# Patient Record
Sex: Female | Born: 1985 | Race: Black or African American | Hispanic: No | Marital: Married | Smoking: Current every day smoker
Health system: Southern US, Community
[De-identification: ages and names within clinical notes are randomized; demographics above are authoritative.]

## PROBLEM LIST (undated history)

## (undated) DIAGNOSIS — F99 Mental disorder, not otherwise specified: Secondary | ICD-10-CM

## (undated) HISTORY — DX: Mental disorder, not otherwise specified: F99

---

## 2008-06-30 ENCOUNTER — Observation Stay: Payer: Self-pay

## 2008-10-01 ENCOUNTER — Observation Stay: Payer: Self-pay | Admitting: Obstetrics and Gynecology

## 2008-10-05 ENCOUNTER — Observation Stay: Payer: Self-pay

## 2008-10-13 ENCOUNTER — Inpatient Hospital Stay: Payer: Self-pay | Admitting: Obstetrics and Gynecology

## 2009-12-04 ENCOUNTER — Encounter: Payer: Self-pay | Admitting: Maternal & Fetal Medicine

## 2010-01-04 ENCOUNTER — Emergency Department: Payer: Self-pay | Admitting: Unknown Physician Specialty

## 2010-04-17 ENCOUNTER — Observation Stay: Payer: Self-pay | Admitting: Obstetrics and Gynecology

## 2012-01-26 HISTORY — PX: ABDOMINAL SURGERY: SHX537

## 2013-01-25 HISTORY — PX: AUGMENTATION MAMMAPLASTY: SUR837

## 2017-03-19 ENCOUNTER — Emergency Department (HOSPITAL_COMMUNITY)
Admission: EM | Admit: 2017-03-19 | Discharge: 2017-03-19 | Discharge status: Against Medical Advice | Payer: Self-pay | Attending: Emergency Medicine | Admitting: Emergency Medicine

## 2017-03-19 ENCOUNTER — Encounter (HOSPITAL_COMMUNITY): Payer: Self-pay | Admitting: Emergency Medicine

## 2017-03-19 ENCOUNTER — Emergency Department (HOSPITAL_COMMUNITY): Payer: Self-pay

## 2017-03-19 DIAGNOSIS — N6452 Nipple discharge: Secondary | ICD-10-CM | POA: Insufficient documentation

## 2017-03-19 DIAGNOSIS — F17210 Nicotine dependence, cigarettes, uncomplicated: Secondary | ICD-10-CM | POA: Insufficient documentation

## 2017-03-19 DIAGNOSIS — Z5320 Procedure and treatment not carried out because of patient's decision for unspecified reasons: Secondary | ICD-10-CM | POA: Insufficient documentation

## 2017-03-19 LAB — BASIC METABOLIC PANEL
ANION GAP: 11 (ref 5–15)
BUN: 11 mg/dL (ref 6–20)
CALCIUM: 9.3 mg/dL (ref 8.9–10.3)
CO2: 19 mmol/L — AB (ref 22–32)
Chloride: 106 mmol/L (ref 101–111)
Creatinine, Ser: 0.94 mg/dL (ref 0.44–1.00)
Glucose, Bld: 163 mg/dL — ABNORMAL HIGH (ref 65–99)
Potassium: 4.5 mmol/L (ref 3.5–5.1)
SODIUM: 136 mmol/L (ref 135–145)

## 2017-03-19 LAB — CBC
HCT: 38.5 % (ref 36.0–46.0)
HEMOGLOBIN: 13.2 g/dL (ref 12.0–15.0)
MCH: 30.7 pg (ref 26.0–34.0)
MCHC: 34.3 g/dL (ref 30.0–36.0)
MCV: 89.5 fL (ref 78.0–100.0)
Platelets: 262 10*3/uL (ref 150–400)
RBC: 4.3 MIL/uL (ref 3.87–5.11)
RDW: 13 % (ref 11.5–15.5)
WBC: 11.7 10*3/uL — AB (ref 4.0–10.5)

## 2017-03-19 LAB — I-STAT BETA HCG BLOOD, ED (MC, WL, AP ONLY)

## 2017-03-19 LAB — I-STAT TROPONIN, ED: TROPONIN I, POC: 0 ng/mL (ref 0.00–0.08)

## 2017-03-19 NOTE — ED Provider Notes (Signed)
Emergency Department Provider Note   I have reviewed the triage vital signs and the nursing notes.   HISTORY  Chief Complaint Breast Discharge   HPI Debra Weaver is a 32 y.o. female without significant past medical history does not take medications the presents to the emergency department today secondary to bilateral breast discharge.  Patient states that she switched up a couple of her psychiatric medications a couple months ago and since that time she is noticed that she been having some breast discharge.  She does not have any pain or lumps or masses or change in the size or sensation of her breast.  States she says a clear milky discharge similar to when she was pregnant however she knows that she is not pregnant.  She endorses a decreased appetite and weight loss during that time as well.  She states her to have some intermittent chest discomfort that lasts approximately 5-7 minutes has not similar to previous episodes of anxiety.  Is having some shortness of breath with that that is mild.  No nausea, lightheadedness or diaphoresis. No other associated or modifying symptoms.    History reviewed. No pertinent past medical history.  There are no active problems to display for this patient.   Past Surgical History:  Procedure Laterality Date  . ABDOMINAL SURGERY  2014      Allergies Patient has no allergy information on record.  History reviewed. No pertinent family history.  Social History Social History   Tobacco Use  . Smoking status: Current Every Day Smoker    Packs/day: 0.25  . Smokeless tobacco: Never Used  Substance Use Topics  . Alcohol use: Yes    Alcohol/week: 0.6 oz    Types: 1 Glasses of wine per week  . Drug use: Yes    Types: Marijuana    Review of Systems  All other systems negative except as documented in the HPI. All pertinent positives and negatives as reviewed in the HPI. ____________________________________________   PHYSICAL  EXAM:  VITAL SIGNS: ED Triage Vitals  Enc Vitals Group     BP 03/19/17 1114 91/72     Pulse Rate 03/19/17 1114 77     Resp 03/19/17 1114 18     Temp 03/19/17 1114 98.8 F (37.1 C)     Temp Source 03/19/17 1114 Oral     SpO2 03/19/17 1114 100 %     Weight 03/19/17 1115 156 lb (70.8 kg)     Height 03/19/17 1115 5\' 4"  (1.626 m)    Constitutional: Alert and oriented. Well appearing and in no acute distress. Eyes: Conjunctivae are normal. PERRL. EOMI. Head: Atraumatic. Nose: No congestion/rhinnorhea. Mouth/Throat: Mucous membranes are moist.  Oropharynx non-erythematous. Neck: No stridor.  No meningeal signs.   Cardiovascular: Normal rate, regular rhythm. Good peripheral circulation. Grossly normal heart sounds.   Respiratory: Normal respiratory effort.  No retractions. Lungs CTAB. Gastrointestinal: Soft and nontender. No distention.  Musculoskeletal: No lower extremity tenderness nor edema. No gross deformities of extremities. Neurologic:  Normal speech and language. No gross focal neurologic deficits are appreciated.  Skin:  Skin is warm, dry and intact. No rash noted. Breast: done with chaperone Lyndel Safe(Elizabeth [place], [place]) present. Mild milky clear white discharge. No tenderness. No masses. No erythema. Breasts symmetrical. No dimpling. No peau d' orange.    ____________________________________________   LABS (all labs ordered are listed, but only abnormal results are displayed)  Labs Reviewed  BASIC METABOLIC PANEL - Abnormal; Notable for the following components:  Result Value   CO2 19 (*)    Glucose, Bld 163 (*)    All other components within normal limits  CBC - Abnormal; Notable for the following components:   WBC 11.7 (*)    All other components within normal limits  PROLACTIN  TSH  URINALYSIS, ROUTINE W REFLEX MICROSCOPIC  I-STAT TROPONIN, ED  I-STAT BETA HCG BLOOD, ED ([facility], [facility], [facility] ONLY)   ____________________________________________  EKG   EKG  Interpretation  Date/Time:  Saturday March 19 2017 11:06:46 EST Ventricular Rate:  88 PR Interval:  112 QRS Duration: 82 QT Interval:  368 QTC Calculation: 445 R Axis:   82 Text Interpretation:  Sinus rhythm with marked sinus arrhythmia Nonspecific T wave abnormality Abnormal ECG No old tracing to compare Confirmed by Marily Memos [phone]) on 03/19/2017 3:13:30 PM       ____________________________________________  RADIOLOGY  Dg Chest 2 View  Result Date: 03/19/2017 CLINICAL DATA:  Pt is having CP and chest pressure x 3 months with some SOB. Pt states that pressure and pain is intermittent and that her SOB is more frequent that the pain. Pt does had a hx of a collapsed lung. Pt was unable to remove nipple piercing's for exam. EXAM: CHEST  2 VIEW COMPARISON:  None. FINDINGS: The heart size and mediastinal contours are within normal limits. Both lungs are clear. No pleural effusion or pneumothorax. The visualized skeletal structures are unremarkable. IMPRESSION: Normal chest radiographs. Electronically Signed   By: Amie Portland M.D.   On: 03/19/2017 12:14    ____________________________________________   PROCEDURES  Procedure(s) performed:   Procedures   ____________________________________________   INITIAL IMPRESSION / ASSESSMENT AND PLAN / ED COURSE  Breast discharge - will check tsh/prolactin Weight loss - likely 2/2 decreased appeitite. Could be related to above and changes in hormone levels but could also be neoplastic or related to medications.  Chest pain and sob. - possibly anxiety. ecg and troponin ok. PERC negative from PE standpoint. CXR without evidence of mass, PTX or other acute causes.   Will add on TSH and prolactin levels and disposition accordingly.   Patient eloped prior to completing workup.   Pertinent labs & imaging results that were available during my care of the patient were reviewed by me and considered in my medical decision making (see  chart for details).  ____________________________________________  FINAL CLINICAL IMPRESSION(S) / ED DIAGNOSES  Final diagnoses:  Breast discharge     MEDICATIONS GIVEN DURING THIS VISIT:  Medications - No data to display   NEW OUTPATIENT MEDICATIONS STARTED DURING THIS VISIT:  There are no discharge medications for this patient.   Note:  This note was prepared with assistance of Dragon voice recognition software. Occasional wrong-word or sound-a-like substitutions may have occurred due to the inherent limitations of voice recognition software.   Marily Memos, MD 03/19/17 [phone]

## 2017-03-19 NOTE — ED Notes (Signed)
Pt not in room per previous nurse who thinks that pt left AMA.

## 2017-03-19 NOTE — ED Triage Notes (Signed)
PT presents to ED for assessment of bilateral nipple discharge (patient states breast milk) for 2 weeks, with intermittent chest pressure that causes SOB.  Patient also c/o loss of appetite for 3 months with weightloss.

## 2017-03-28 ENCOUNTER — Encounter: Payer: Self-pay | Admitting: Family Medicine

## 2017-03-28 ENCOUNTER — Ambulatory Visit (INDEPENDENT_AMBULATORY_CARE_PROVIDER_SITE_OTHER): Payer: Medicaid Other | Admitting: Family Medicine

## 2017-03-28 ENCOUNTER — Other Ambulatory Visit (HOSPITAL_COMMUNITY)
Admission: RE | Admit: 2017-03-28 | Discharge: 2017-03-28 | Disposition: A | Discharge status: Home / Self Care | Payer: Medicaid Other | Source: Ambulatory Visit | Attending: Family Medicine | Admitting: Family Medicine

## 2017-03-28 VITALS — BP 115/79 | HR 81 | Ht 64.0 in | Wt 136.0 lb

## 2017-03-28 DIAGNOSIS — N643 Galactorrhea not associated with childbirth: Secondary | ICD-10-CM | POA: Diagnosis present

## 2017-03-28 DIAGNOSIS — Z309 Encounter for contraceptive management, unspecified: Secondary | ICD-10-CM | POA: Diagnosis not present

## 2017-03-28 DIAGNOSIS — R634 Abnormal weight loss: Secondary | ICD-10-CM | POA: Insufficient documentation

## 2017-03-28 DIAGNOSIS — Z01419 Encounter for gynecological examination (general) (routine) without abnormal findings: Secondary | ICD-10-CM

## 2017-03-28 DIAGNOSIS — N63 Unspecified lump in unspecified breast: Secondary | ICD-10-CM

## 2017-03-28 MED ORDER — CEPHALEXIN 500 MG PO CAPS: 500.0000 mg | ORAL_CAPSULE | Freq: Three times a day (TID) | ORAL | 0 refills | Status: DC

## 2017-03-28 NOTE — Progress Notes (Signed)
Breast lump on left and leakage from both.patient states she stopped taking her trazodone and risperone this week. Armandina StammerJennifer Shanara Schnieders RN BSN

## 2017-03-28 NOTE — Progress Notes (Signed)
GYNECOLOGY ANNUAL PREVENTATIVE CARE ENCOUNTER NOTE  Subjective:   Debra Weaver is a 32 y.o. G4P3 female here for a routine annual gynecologic exam.  Current complaints:  1. Breast lump - noticed this morning. Tender. 2. Weight loss - 30# in a month.  Has loss of appetite. 3. Galactorrhea - was on Risperidone and trazodone for 3-4 weeks due to weight loss. Stopped them about 1-2 weeks ago. Noticing that the galactorhhea is improving. 4. Hot flashes - frequent with night sweats   Denies abnormal vaginal bleeding, discharge, pelvic pain, problems with intercourse or other gynecologic concerns.    Does have history of breast implants - over muscle. Thinks that they are silacone.  Gynecologic History Patient's last menstrual period was 03/17/2017. Patient is sexually active  Contraception: none Last Pap: unsure. Results were: normal Last mammogram: n/a.  Obstetric History OB History  Gravida Para Term Preterm AB Living  4 3          SAB TAB Ectopic Multiple Live Births          3    # Outcome Date GA Lbr Len/2nd Weight Sex Delivery Anes PTL Lv  4 Gravida           3 Para           2 Para           1 Para               Past Medical History:  Diagnosis Date  . Mental disorder    bipolar - stopped meds    Past Surgical History:  Procedure Laterality Date  . ABDOMINAL SURGERY  2014  . CESAREAN SECTION      Current Outpatient Medications on File Prior to Visit  Medication Sig Dispense Refill  . risperiDONE (RISPERDAL) 0.25 MG tablet Take 0.25 mg by mouth at bedtime.    . traZODone (DESYREL) 150 MG tablet Take by mouth at bedtime.     No current facility-administered medications on file prior to visit.     No Known Allergies  Social History   Socioeconomic History  . Marital status: Married    Spouse name: Not on file  . Number of children: Not on file  . Years of education: Not on file  . Highest education level: Not on file  Social Needs  . Financial  resource strain: Not on file  . Food insecurity - worry: Not on file  . Food insecurity - inability: Not on file  . Transportation needs - medical: Not on file  . Transportation needs - non-medical: Not on file  Occupational History  . Not on file  Tobacco Use  . Smoking status: Current Every Day Smoker    Packs/day: 0.25  . Smokeless tobacco: Never Used  Substance and Sexual Activity  . Alcohol use: Yes    Alcohol/week: 0.6 oz    Types: 1 Glasses of wine per week  . Drug use: Yes    Types: Marijuana  . Sexual activity: Not on file  Other Topics Concern  . Not on file  Social History Narrative  . Not on file    Family History  Problem Relation Age of Onset  . Cancer Maternal Grandmother   . Breast cancer Maternal Grandmother   . Cancer Mother   . Breast cancer Mother   . Diabetes Neg Hx   . Hypertension Neg Hx     The following portions of the patient's history were reviewed and updated  as appropriate: allergies, current medications, past family history, past medical history, past social history, past surgical history and problem list.  Review of Systems Pertinent items noted in HPI and remainder of comprehensive ROS otherwise negative.   Objective:  BP 115/79   Pulse 81   Ht _0  (1.626 m)   Wt 136 lb (61.7 kg)   LMP 03/17/2017   BMI 23.34 kg/m  CONSTITUTIONAL: Well-developed, well-nourished female in no acute distress.  HENT:  Normocephalic, atraumatic, External right and left ear normal. Oropharynx is clear and moist EYES: Conjunctivae and EOM are normal. Pupils are equal, round, and reactive to light. No scleral icterus.  NECK: Normal range of motion, supple, no masses.  Normal thyroid.   CARDIOVASCULAR: Normal heart rate noted, regular rhythm RESPIRATORY: Clear to auscultation bilaterally. Effort and breath sounds normal, no problems with respiration noted. BREASTS: Symmetric in size. Right breast: No masses, skin changes, nipple drainage, or  lymphadenopathy. Left breast: 3cm firm indurated, warm, tender lump 6-7 oclock at edge of areola.  ABDOMEN: Soft, normal bowel sounds, no distention noted.  No tenderness, rebound or guarding.  PELVIC: Normal appearing external genitalia; normal appearing vaginal mucosa and cervix.  No abnormal discharge noted.  Pap smear obtained.  Normal uterine size, no other palpable masses, no uterine or adnexal tenderness. MUSCULOSKELETAL: Normal range of motion. No tenderness.  No cyanosis, clubbing, or edema.  2+ distal pulses. SKIN: Skin is warm and dry. No rash noted. Not diaphoretic. No erythema. No pallor. NEUROLOGIC: Alert and oriented to person, place, and time. Normal reflexes, muscle tone coordination. No cranial nerve deficit noted. PSYCHIATRIC: Normal mood and affect. Normal behavior. Normal judgment and thought content.  Assessment:  Annual gynecologic examination with pap smear   Plan:  1. Well Woman Exam Will follow up results of pap smear and manage accordingly. STD testing discussed. Patient requested testing  - Cytology - PAP - Comp Met (CMET) - TSH - CBC - Hepatitis B surface antigen - Hepatitis C antibody - HIV antibody - Follicle stimulating hormone  2. Breast lump in female Appears infectious - will treat with keflex. No fluctuance. If does not improve after abx, will send for imaging.  3. Galactorrhea Likely secondary to risperidone.  - Prolactin  4. Weight loss - Comp Met (CMET) - TSH - HIV antibody   Routine preventative health maintenance measures emphasized. Please refer to After Visit Summary for other counseling recommendations.    Loma Boston, [place] for [employer]

## 2017-03-29 LAB — COMPREHENSIVE METABOLIC PANEL
ALK PHOS: 65 IU/L (ref 39–117)
ALT: 9 IU/L (ref 0–32)
AST: 23 IU/L (ref 0–40)
Albumin/Globulin Ratio: 1.5 (ref 1.2–2.2)
Albumin: 4.6 g/dL (ref 3.5–5.5)
BUN/Creatinine Ratio: 11 (ref 9–23)
BUN: 9 mg/dL (ref 6–20)
Bilirubin Total: 0.3 mg/dL (ref 0.0–1.2)
CALCIUM: 9.4 mg/dL (ref 8.7–10.2)
CO2: 19 mmol/L — AB (ref 20–29)
CREATININE: 0.79 mg/dL (ref 0.57–1.00)
Chloride: 105 mmol/L (ref 96–106)
GFR calc Af Amer: 115 mL/min/{1.73_m2} (ref >59)
GFR, EST NON AFRICAN AMERICAN: 100 mL/min/{1.73_m2} (ref >59)
GLOBULIN, TOTAL: 3.1 g/dL (ref 1.5–4.5)
GLUCOSE: 61 mg/dL — AB (ref 65–99)
Potassium: 4.3 mmol/L (ref 3.5–5.2)
SODIUM: 140 mmol/L (ref 134–144)
Total Protein: 7.7 g/dL (ref 6.0–8.5)

## 2017-03-29 LAB — CBC
HEMATOCRIT: 38 % (ref 34.0–46.6)
Hemoglobin: 12.7 g/dL (ref 11.1–15.9)
MCH: 30.4 pg (ref 26.6–33.0)
MCHC: 33.4 g/dL (ref 31.5–35.7)
MCV: 91 fL (ref 79–97)
Platelets: 253 10*3/uL (ref 150–379)
RBC: 4.18 x10E6/uL (ref 3.77–5.28)
RDW: 13.7 % (ref 12.3–15.4)
WBC: 7.8 10*3/uL (ref 3.4–10.8)

## 2017-03-29 LAB — HEPATITIS C ANTIBODY: Hep C Virus Ab: <0.1 s/co ratio (ref 0.0–0.9)

## 2017-03-29 LAB — HEPATITIS B SURFACE ANTIGEN: HEP B S AG: NEGATIVE

## 2017-03-29 LAB — TSH: TSH: 1.62 u[IU]/mL (ref 0.450–4.500)

## 2017-03-29 LAB — HIV ANTIBODY (ROUTINE TESTING W REFLEX): HIV SCREEN 4TH GENERATION: NONREACTIVE

## 2017-03-29 LAB — FOLLICLE STIMULATING HORMONE: FSH: 3.5 m[IU]/mL

## 2017-03-29 LAB — PROLACTIN: PROLACTIN: 17.7 ng/mL (ref 4.8–23.3)

## 2017-03-30 ENCOUNTER — Telehealth: Payer: Self-pay

## 2017-03-30 NOTE — Telephone Encounter (Signed)
Patient called stating her breast lump has gotten bigger and more painful from Monday 03-28-17. Patient states she is taking the antibiotic we gave her. Suggested trying warm compress on the area and tylenol for pain.  Patient requesting to come in sooner, patient booked from tomorrow 03-31-17. Armandina StammerJennifer Howard RNBSN

## 2017-03-31 ENCOUNTER — Emergency Department (HOSPITAL_COMMUNITY): Payer: Self-pay

## 2017-03-31 ENCOUNTER — Encounter (HOSPITAL_COMMUNITY): Payer: Self-pay

## 2017-03-31 ENCOUNTER — Encounter: Payer: Self-pay | Admitting: Family Medicine

## 2017-03-31 ENCOUNTER — Emergency Department (HOSPITAL_COMMUNITY)
Admission: EM | Admit: 2017-03-31 | Discharge: 2017-03-31 | Disposition: A | Discharge status: Home / Self Care | Payer: Self-pay | Attending: Emergency Medicine | Admitting: Emergency Medicine

## 2017-03-31 ENCOUNTER — Ambulatory Visit (INDEPENDENT_AMBULATORY_CARE_PROVIDER_SITE_OTHER): Payer: Self-pay | Admitting: Family Medicine

## 2017-03-31 VITALS — BP 130/82 | HR 90 | Wt 140.0 lb

## 2017-03-31 DIAGNOSIS — R197 Diarrhea, unspecified: Secondary | ICD-10-CM | POA: Insufficient documentation

## 2017-03-31 DIAGNOSIS — N61 Mastitis without abscess: Secondary | ICD-10-CM

## 2017-03-31 DIAGNOSIS — N611 Abscess of the breast and nipple: Secondary | ICD-10-CM

## 2017-03-31 DIAGNOSIS — Z79899 Other long term (current) drug therapy: Secondary | ICD-10-CM | POA: Insufficient documentation

## 2017-03-31 DIAGNOSIS — L03313 Cellulitis of chest wall: Secondary | ICD-10-CM | POA: Insufficient documentation

## 2017-03-31 DIAGNOSIS — R0602 Shortness of breath: Secondary | ICD-10-CM | POA: Insufficient documentation

## 2017-03-31 DIAGNOSIS — F17210 Nicotine dependence, cigarettes, uncomplicated: Secondary | ICD-10-CM | POA: Insufficient documentation

## 2017-03-31 LAB — CYTOLOGY - PAP
ADEQUACY: ABSENT
CHLAMYDIA, DNA PROBE: NEGATIVE
Diagnosis: NEGATIVE
HPV (WINDOPATH): NOT DETECTED
NEISSERIA GONORRHEA: NEGATIVE

## 2017-03-31 MED ORDER — ACETAMINOPHEN 325 MG PO TABS: 650.0000 mg | ORAL_TABLET | Freq: Four times a day (QID) | ORAL | 0 refills | Status: AC | PRN

## 2017-03-31 MED ORDER — DOXYCYCLINE HYCLATE 100 MG PO CAPS: 100.0000 mg | ORAL_CAPSULE | Freq: Two times a day (BID) | ORAL | 0 refills | Status: AC

## 2017-03-31 NOTE — ED Notes (Signed)
Bed: WTR6 Expected date:  Expected time:  Means of arrival:  Comments: 

## 2017-03-31 NOTE — Progress Notes (Signed)
Patient is cussing at intake about emergency room experience. Patient states that no one told her what is going on. Let patient voice her concerns of her ER experience and then told her we will evaluate her today.  Armandina StammerJennifer Angala Hilgers RNBSN     After exam when trying to schedule her Imaging since she has medicaid family planning  patient walked out of office before we could get her imaging scheduled. Unable to schedule with [facility] of [place] due to her insurance coverage. We were in process of contacting [facility] program to see what financial assistance could be started when patient walked out of office. Armandina StammerJennifer Freeman Borba RNBSN

## 2017-03-31 NOTE — ED Notes (Signed)
Pt left w/o discharge paperwork

## 2017-03-31 NOTE — Discharge Instructions (Signed)
You were given a prescription for antibiotics. Please take the antibiotic prescription fully.  You were Also given a prescription for Tylenol.  You may take 650 mg of Tylenol every 6 hours as needed for pain.  Do not take more than 4000 mg of Tylenol in 1 day.  Please check with your pharmacist to make sure that neither of these medications interact with the medications that you are currently on.  If you have any symptoms that you cannot tolerate while taking these medications, stop the medication immediately.  Please follow up with your primary doctor within the next 7-10 days for re-evaluation and further treatment of your symptoms.   Please return to the ER sooner if you have any new or worsening symptoms including chest pain, shortness of breath, or fevers.

## 2017-03-31 NOTE — Progress Notes (Signed)
   Subjective:    Patient ID: Debra Weaver, female    DOB: 07/08/1985, 32 y.o.   MRN: [id number]  HPI Patient seen - was seen 3 days ago for annual and had painful breast lump that started earlier that day. Appears to be an abscess - keflex prescribed. Patient has taken the antibiotic for the past 3 days without improvement. [facility] and was prescribed doxy. No fevers, chills, nausea.   Review of Systems     Objective:   Physical Exam  Constitutional: She is oriented to person, place, and time. She appears well-developed and well-nourished.  Pulmonary/Chest:    Neurological: She is alert and oriented to person, place, and time.  Skin: Skin is warm and dry.  Psychiatric: She has a normal mood and affect. Her behavior is normal. Judgment and thought content normal.      Assessment & Plan:  1. Breast Abscess Start doxycycline as prescribed. Attempted to make appt for mammogram and US for breast at breast center, but [facility] While trying to figure this, pt left office. Will refer pt to [facility] for imaging. - US BREAST LTD UNI LEFT INC AXILLA; Future - MM DIAG BREAST TOMO BILATERAL; Future

## 2017-03-31 NOTE — ED Provider Notes (Addendum)
[facility]-EMERGENCY DEPT Provider Note   CSN: [id number] Arrival date & time: 03/31/17  0139     History   Chief Complaint Chief Complaint  Patient presents with  . Abscess    HPI Danuta Huseman is a 32 y.o. female.  HPI   Patient is a 32 year old female who presents the ED today complaining of 9/10 left breast pain that began 1 week ago.  States pain is stabbing/burning.  She is tried warm compresses with no relief.  Also has tried Tylenol with no relief.  Pain is worse to palpation.  Patient states that she was seen by her OB/GYN 3 days ago and was started on Keflex for suspected cellulitis.  Since then the pain, swelling, and redness has gotten worse.  She denies any drainage from the area.  She does endorse bilateral nipple discharge for several months, that her doctor states is due to her being on risperidone.  Patient has history of implants in 2015, had procedure done by plastic surgeon in [place], [place].  Patient is also complaining of central chest heaviness that has been present for 2 months.  Has associated intermittent shortness of breath, but none now.  No cough or other URI symptoms.  Denies any abdominal pain, nausea, vomiting.  Endorses intermittent diarrhea, denies any blood in stool.  Denies any documented fevers at home, but does endorse night sweats.  Also endorses 32 pound weight loss in 60 days.  States she has had loss of appetite.  Denies leg pain/swelling, hemoptysis, recent surgery/trauma, recent long travel, hormone use, personal hx of cancer, or hx of DVT/PE.   Past Medical History:  Diagnosis Date  . Mental disorder    bipolar - stopped meds    There are no active problems to display for this patient.   Past Surgical History:  Procedure Laterality Date  . ABDOMINAL SURGERY  2014  . CESAREAN SECTION      OB History    Gravida Para Term Preterm AB Living   4 3           SAB TAB Ectopic Multiple Live Births           3        Home Medications    Prior to Admission medications   Medication Sig Start Date End Date Taking? Authorizing Provider  acetaminophen (TYLENOL) 325 MG tablet Take 2 tablets (650 mg total) by mouth every 6 (six) hours as needed. Do not take more than 4000mg  of tylenol per day 03/31/17   Darlene Bartelt S, PA-C  cephALEXin (KEFLEX) 500 MG capsule Take 1 capsule (500 mg total) by mouth 3 (three) times daily. 03/28/17   Levie Heritage, DO  doxycycline (VIBRAMYCIN) 100 MG capsule Take 1 capsule (100 mg total) by mouth 2 (two) times daily for 7 days. 03/31/17 04/07/17  Janira Mandell S, PA-C  risperiDONE (RISPERDAL) 0.25 MG tablet Take 0.25 mg by mouth at bedtime.    [provider]  traZODone (DESYREL) 150 MG tablet Take by mouth at bedtime.    [provider]    Family History Family History  Problem Relation Age of Onset  . Cancer Maternal Grandmother   . Breast cancer Maternal Grandmother   . Cancer Mother   . Breast cancer Mother   . Diabetes Neg Hx   . Hypertension Neg Hx     Social History Social History   Tobacco Use  . Smoking status: Current Every Day Smoker    Packs/day:  0.25  . Smokeless tobacco: Never Used  Substance Use Topics  . Alcohol use: Yes    Alcohol/week: 0.6 oz    Types: 1 Glasses of wine per week  . Drug use: Yes    Types: Marijuana     Allergies   Patient has no known allergies.   Review of Systems Review of Systems  Constitutional: Positive for appetite change and unexpected weight change. Negative for fever.       Night sweats  HENT: Negative for congestion and sore throat.   Eyes: Negative for visual disturbance.  Respiratory: Positive for shortness of breath. Negative for cough.   Cardiovascular: Positive for chest pain. Negative for palpitations and leg swelling.  Gastrointestinal: Positive for diarrhea. Negative for abdominal pain, constipation, nausea and vomiting.  Genitourinary: Negative for flank pain.   Musculoskeletal: Negative for back pain and neck pain.  Skin:       Redness, pain, swelling to left breast  Neurological: Negative for headaches.     Physical Exam Updated Vital Signs BP 111/63 (BP Location: Left Arm)   Pulse 98   Temp 98 F (36.7 C) (Oral)   Resp 20   LMP 03/17/2017   SpO2 100%   Physical Exam  Constitutional: She appears well-developed and well-nourished. No distress.  HENT:  Head: Normocephalic and atraumatic.  Eyes: Conjunctivae are normal.  Neck: Neck supple.  Cardiovascular: Normal rate, regular rhythm, normal heart sounds and intact distal pulses.  No murmur heard. Pulmonary/Chest: Effort normal and breath sounds normal. No stridor. No respiratory distress. She has no wheezes.  Chest tenderness to central part of chest, that reproduces pain.  Abdominal: Soft. Bowel sounds are normal. She exhibits no distension. There is no tenderness.  Musculoskeletal: She exhibits no edema.  Neurological: She is alert.  Skin: Skin is warm and dry.  Pt has cellulitic area to left breast to medial and inferior aspect of left breast that is TTP and erythematous. Area is indurated, but no fluctuance noted. No obvious drainage noted on exam  Psychiatric: She has a normal mood and affect.  Nursing note and vitals reviewed.    ED Treatments / Results  Labs (all labs ordered are listed, but only abnormal results are displayed) Labs Reviewed - No data to display  EKG  EKG Interpretation None       Radiology Dg Chest 2 View  Result Date: 03/31/2017 CLINICAL DATA:  Left breast pain swelling, erythema, and warmth sensation. The patient has been on antibiotics for the past 3 days but the symptoms are worsening. EXAM: CHEST - 2 VIEW COMPARISON:  Chest x-ray dated March 19, 2017 FINDINGS: The lungs are well-expanded. There is no focal infiltrate. There is no pleural effusion. The heart and pulmonary vascularity are normal. The mediastinum is normal in width. The  trachea is midline. The bony thorax exhibits no acute abnormality. There are breast implants present bilaterally. No soft tissue gas collections within the visualized portions of the breasts are seen. IMPRESSION: There is no active cardiopulmonary disease. No specific radiographic abnormality of either breast. There are bilateral breast implants. Electronically Signed   By: David  Swazil[place]Jordan M.D.   On: 03/31/2017 07:52    Procedures Procedures (including critical care time)  Medications Ordered in ED Medications - No data to display   Initial Impression / Assessment and Plan / ED Course  I have reviewed the triage vital signs and the nursing notes.  Pertinent labs & imaging results that were available during my care of  the patient were reviewed by me and considered in my medical decision making (see chart for details).   Pt became agitated stating that doctors don't know what they are doing and that she has had bad experiences with medical encounters in the past. states she has sued mult hospitals in the past.  Discussed pt presentation and exam findings with Dr. Effie Shy, who personally evaluated the patient.  Patient left before I was able to see her after Dr. Effie Shy.  She left without me being able to give her discharge paperwork.  I did discuss the plan to give different antibiotics as well as pain medication, and advised her to follow-up with her OB/GYN doctor and return if worse. Had discussed results of chest x-ray as well.  Gave discharge instructions to patient husband.   Final Clinical Impressions(s) / ED Diagnoses   Final diagnoses:  Cellulitis of left breast   Patient is a 32 year old female who presents to the ED complaining of left breast abscess which has worsened since starting Keflex 3 days ago.  Will change antibiotic to MRSA covering agent.  Patient has follow-up with her OB/GYN today.  Discussed follow-up with patient's plastic surgeon, however plastic surgeon is located to  [place].  Patient agrees to keep appointment with OB-GYN today for further assessment and workup of this.  She has no evidence of systemic infection including no fevers. normal vital signs.  Area of concern is not amenable to I&D. Patient complained of chest pain x2 months, however had negative chest pain workup in the ED on 03/19/17 for same pain which is unchanged today.  Patient has no history of cardiac disease, she has minimal risk factors.  Doubt ACS.  Doubt PE, PERC negative. CXR negative for PNA or PTX. CP reproducible on exam. Doubt other life threatening cardiopulmonary etiology of CP at this point. Appropriate for outpt workup. Pt given strict return precautions for new or worsening pain or sob. Agrees to keep appt at 830 and return if worse.   ED Discharge Orders        Ordered    doxycycline (VIBRAMYCIN) 100 MG capsule  2 times daily     03/31/17 0725    acetaminophen (TYLENOL) 325 MG tablet  Every 6 hours PRN     03/31/17 0725       Winslow Ederer, [employer], PA-C 03/31/17 1724    Reita Shindler S, PA-C 03/31/17 1914    Mancel Bale, MD 04/01/17 1206

## 2017-03-31 NOTE — ED Provider Notes (Signed)
  Face-to-face evaluation   History: The patient presents for evaluation of the left breast problem described by her as drainage.  I saw the patient at 08:20 hours.  She had been seen earlier by the APP, and after we discussed the case, and I reviewed the record, I want to see the patient.  At this point the patient was fully dressed, pacing in the room, angry, cursing, and stated that "you people are not doing anything for me."  She then stated that she had an appointment at 9:00 and would go to see Dr. Adrian BlackwaterStinson for that visit.  I encouraged her that this made sense because she could get good comprehensive treatment there as he has already evaluated her, 4 days ago.  The patient was with her husband and 3 children at this time, and they decided to walk out without discharge instructions or the prescription which had been written earlier.  Physical exam: Alert female.  Ambulating without distress.  She is fully clothed and did not participate in physical exam for me.  Medical screening examination/treatment/procedure(s) were conducted as a shared visit with non-physician practitioner(s) and myself.  I personally evaluated the patient during the encounter    Mancel BaleWentz, Debra Brackney, MD 03/31/17 [phone]

## 2017-03-31 NOTE — ED Notes (Signed)
Pt complains of her left breast being red, swollen and warm, she states that it's very sore and has been taking an antibiotic since Monday but the pain and swelling is worse

## 2017-04-01 ENCOUNTER — Telehealth: Payer: Self-pay

## 2017-04-01 ENCOUNTER — Other Ambulatory Visit (HOSPITAL_COMMUNITY): Payer: Self-pay | Admitting: *Deleted

## 2017-04-01 ENCOUNTER — Other Ambulatory Visit: Payer: Self-pay | Admitting: Family Medicine

## 2017-04-01 DIAGNOSIS — N611 Abscess of the breast and nipple: Secondary | ICD-10-CM

## 2017-04-01 DIAGNOSIS — N6320 Unspecified lump in the left breast, unspecified quadrant: Secondary | ICD-10-CM

## 2017-04-01 NOTE — Telephone Encounter (Signed)
Contacted patient to make her aware that she will meet with Debra B. With [facility] and then have her visit with Dr. Adrian Weaver. Patient states understanding and given [facility].   Stressed importance for patient to start taking her medication so that she has several days on board before being evaluated again. Debra StammerJennifer Howard RNBSN

## 2017-04-04 ENCOUNTER — Telehealth: Payer: Self-pay

## 2017-04-04 ENCOUNTER — Ambulatory Visit: Payer: Medicaid Other | Admitting: Family Medicine

## 2017-04-04 ENCOUNTER — Ambulatory Visit (HOSPITAL_COMMUNITY): Payer: Medicaid Other

## 2017-04-04 NOTE — Telephone Encounter (Signed)
Patient arrived at our office at 10am today stating that she was told to come here for follow up appointment. Spoke with patient reminding her of the address I gave her on Friday ([address] [place] patient states someone called her after me stating for her to go to the place where she saw Dr. Adrian BlackwaterStinson.  Called the clinic to may them aware I am sending the patient that way.   Received a call at 11 05 from the patient that she is at the breast clinic because [facility]. Sounds like from her descriptions she went to women's and went to the imaging department instead of going to the clinic.   Contacted the clinic and Dr. Adrian BlackwaterStinson made aware of situation. Per Dr. Adrian BlackwaterStinson patient is going to be worked in tomorrow morning first thing. Armandina StammerJennifer Brytni Dray RNBSN

## 2017-04-05 ENCOUNTER — Ambulatory Visit (INDEPENDENT_AMBULATORY_CARE_PROVIDER_SITE_OTHER): Payer: Medicaid Other | Admitting: Family Medicine

## 2017-04-05 ENCOUNTER — Other Ambulatory Visit: Payer: Self-pay | Admitting: Family Medicine

## 2017-04-05 ENCOUNTER — Ambulatory Visit (HOSPITAL_COMMUNITY)
Admission: RE | Admit: 2017-04-05 | Discharge: 2017-04-05 | Disposition: A | Discharge status: Home / Self Care | Payer: Medicaid Other | Source: Ambulatory Visit | Attending: Family Medicine | Admitting: Family Medicine

## 2017-04-05 ENCOUNTER — Ambulatory Visit
Admission: RE | Admit: 2017-04-05 | Discharge: 2017-04-05 | Disposition: A | Discharge status: Home / Self Care | Payer: No Typology Code available for payment source | Source: Ambulatory Visit | Attending: Family Medicine | Admitting: Family Medicine

## 2017-04-05 ENCOUNTER — Ambulatory Visit
Admission: RE | Admit: 2017-04-05 | Discharge: 2017-04-05 | Disposition: A | Discharge status: Home / Self Care | Payer: Medicaid Other | Source: Ambulatory Visit | Attending: Family Medicine | Admitting: Family Medicine

## 2017-04-05 ENCOUNTER — Encounter (HOSPITAL_COMMUNITY): Payer: Self-pay

## 2017-04-05 ENCOUNTER — Encounter (HOSPITAL_COMMUNITY): Payer: Self-pay | Admitting: *Deleted

## 2017-04-05 VITALS — BP 105/71 | Ht 64.0 in | Wt 136.2 lb

## 2017-04-05 DIAGNOSIS — N611 Abscess of the breast and nipple: Secondary | ICD-10-CM

## 2017-04-05 DIAGNOSIS — N6320 Unspecified lump in the left breast, unspecified quadrant: Secondary | ICD-10-CM

## 2017-04-05 DIAGNOSIS — Z1239 Encounter for other screening for malignant neoplasm of breast: Secondary | ICD-10-CM

## 2017-04-05 DIAGNOSIS — N644 Mastodynia: Secondary | ICD-10-CM

## 2017-04-05 NOTE — Patient Instructions (Addendum)
Explained breast self awareness with Debra Weaver. Patient did not need a Pap smear today due to last Pap smear and HPV typing was 03/28/2017. Let her know BCCCP will cover Pap smears and HPV typing every 5 years unless has a history of abnormal Pap smears. Referred patient to [facility] for a diagnostic mammogram and left breast ultrasound. Appointment scheduled for Tuesday, April 05, 2017 at 1010. Patient aware of appointment and will be there. Discussed smoking cessation with patient. Referred patient to the [facility] Quitline and gave resources to the free smoking cessation classes at [facility]. Debra Weaver verbalized understanding.  Debra Weaver, [name], RN 9:19 AM

## 2017-04-05 NOTE — Progress Notes (Signed)
   Subjective:    Patient ID: Debra Weaver, female    DOB: 05/25/1985, 32 y.o.   MRN: [id number]  HPI Patient seen for f/u breast abscess. Has been on doxycycline since Friday, has not improved. Has not missed dose. No fevers/ chills.   Review of Systems     Objective:   Physical Exam  Constitutional: She appears well-developed and well-nourished.  Pulmonary/Chest:            Assessment & Plan:  1. Breast abscess Seen inconjunction with BCCCP nurse. Pt to go to Breast center for imaging. Continue antibiotics.

## 2017-04-05 NOTE — Progress Notes (Addendum)
[facility] due to a left breast abscess with worsening symptoms with antibiotics. Patient is currently taking Doxycyline.   Today patient complained of a left breast lump x 2 weeks that has increased in size, redness, and pain. Patient states the pain is constant with sharp pains that come and go. Patient rates the pain at a 8 out of 10.    Pap Smear: Pap smear not completed today. Last Pap smear was 03/28/2017 at [facility] and normal with negative HPV. Per patient has no history of an abnormal Pap smear. Last Pap smear result is in Epic.  Physical exam: Breasts Breasts symmetrical. No skin abnormalities right breast. Left breast red under nipple between 6-9 o'clock. No nipple retraction bilateral breasts. No nipple discharge bilateral breasts. No lymphadenopathy. No lumps palpated right breast. Palpated a lump within the left breast under areola between 6-9 o'clock. Complaints of pain when palpated left lower breast on exam. Referred patient to the [facility] for a diagnostic mammogram and left breast ultrasound. Appointment scheduled for Tuesday, April 05, 2017 at 1010.        Pelvic/Bimanual No Pap smear completed today since last Pap smear and HPV typing was 03/28/2017. Pap smear not indicated per BCCCP guidelines.   Smoking History: Patient is a current smoker. Discussed smoking cessation with patient. Referred patient to the [facility] Quitline and gave resources to the free smoking cessation classes at [facility].  Patient Navigation: Patient education provided. Access to services provided for patient through BCCCP program.    Breast and Cervical Cancer Risk Assessment: Patient has a family history of her mother and maternal grandmother being diagnosed with breast cancer. Patient has no known genetic mutations or history of radiation treatment to the chest before age  32. Patient has no history of cervical dysplasia, immunocompromised, or DES exposure in-utero.

## 2017-04-06 ENCOUNTER — Encounter (HOSPITAL_COMMUNITY): Payer: Self-pay | Admitting: *Deleted

## 2017-04-11 ENCOUNTER — Ambulatory Visit: Payer: Medicaid Other | Admitting: Family Medicine

## 2017-04-11 ENCOUNTER — Other Ambulatory Visit: Payer: Self-pay

## 2017-04-12 LAB — AEROBIC/ANAEROBIC CULTURE (SURGICAL/DEEP WOUND)

## 2017-04-12 LAB — AEROBIC/ANAEROBIC CULTURE W GRAM STAIN (SURGICAL/DEEP WOUND)

## 2017-04-13 ENCOUNTER — Ambulatory Visit
Admission: RE | Admit: 2017-04-13 | Discharge: 2017-04-13 | Disposition: A | Discharge status: Home / Self Care | Payer: Self-pay | Source: Ambulatory Visit | Attending: Family Medicine | Admitting: Family Medicine

## 2017-04-13 DIAGNOSIS — N611 Abscess of the breast and nipple: Secondary | ICD-10-CM

## 2017-05-20 ENCOUNTER — Other Ambulatory Visit (HOSPITAL_COMMUNITY): Payer: Self-pay | Admitting: *Deleted

## 2017-05-20 ENCOUNTER — Telehealth: Payer: Self-pay | Admitting: Family Medicine

## 2017-05-20 DIAGNOSIS — N6320 Unspecified lump in the left breast, unspecified quadrant: Secondary | ICD-10-CM

## 2017-05-20 NOTE — Telephone Encounter (Signed)
Called patient and she states she needs a follow up scheduled with the Breast Center. Patient states when she contacted them, she was informed we had to refer her to them. Told patient she will need to contact BCCCP so they can set that up for her since she goes through them & provided contact number. Patient verbalized understanding & had no questions.

## 2017-05-20 NOTE — Telephone Encounter (Signed)
Patient said she need another referral to the breast center

## 2017-05-23 ENCOUNTER — Other Ambulatory Visit (HOSPITAL_COMMUNITY): Payer: Self-pay | Admitting: Obstetrics and Gynecology

## 2017-05-23 DIAGNOSIS — N6320 Unspecified lump in the left breast, unspecified quadrant: Secondary | ICD-10-CM

## 2017-06-02 ENCOUNTER — Ambulatory Visit
Admission: RE | Admit: 2017-06-02 | Discharge: 2017-06-02 | Disposition: A | Discharge status: Home / Self Care | Payer: No Typology Code available for payment source | Source: Ambulatory Visit | Attending: Obstetrics and Gynecology | Admitting: Obstetrics and Gynecology

## 2017-06-02 ENCOUNTER — Encounter (HOSPITAL_COMMUNITY): Payer: Self-pay

## 2017-06-02 ENCOUNTER — Ambulatory Visit (HOSPITAL_COMMUNITY)
Admission: RE | Admit: 2017-06-02 | Discharge: 2017-06-02 | Disposition: A | Discharge status: Home / Self Care | Payer: Medicaid Other | Source: Ambulatory Visit | Attending: Obstetrics and Gynecology | Admitting: Obstetrics and Gynecology

## 2017-06-02 ENCOUNTER — Other Ambulatory Visit (HOSPITAL_COMMUNITY): Payer: Self-pay | Admitting: Obstetrics and Gynecology

## 2017-06-02 VITALS — BP 110/70 | Ht 64.0 in | Wt 133.4 lb

## 2017-06-02 DIAGNOSIS — N6320 Unspecified lump in the left breast, unspecified quadrant: Secondary | ICD-10-CM

## 2017-06-02 DIAGNOSIS — Z1239 Encounter for other screening for malignant neoplasm of breast: Secondary | ICD-10-CM

## 2017-06-02 DIAGNOSIS — N644 Mastodynia: Secondary | ICD-10-CM

## 2017-06-02 DIAGNOSIS — N611 Abscess of the breast and nipple: Secondary | ICD-10-CM

## 2017-06-02 NOTE — Progress Notes (Signed)
Patient returned to [facility] due to a recurrence of left breast abcess x 2 weeks that she went to the ED in [place]. Patient stated it was aspirated and there has been drainage from her breast since. Patient stated there is now a hole in her breast. Patient states the pain is constant and has increased. Patient rates the pain at a 10+. Patient is currently on antibiotics that she started two days ago. She was prescribed Cephalexin 500 mg PO QID x 10 days and Bactrim.  Pap Smear: Pap smear not completed today. Last Pap smear was 03/28/2017 at the [facility] and normal with negative HPV. Per patient has no history of an abnormal Pap smear. Last Pap smear result is in Epic.  Physical exam: Breasts Breasts symmetrical. No skin abnormalities right breast. An open area observed on the left between 6-9 o'clock that is draining. The area around the open area is reddened. No nipple retraction bilateral breasts. No nipple discharge bilateral breasts. No lymphadenopathy. No lumps palpated right breast. Unable to palpate left breast lump due to patients complaint of pain on exam. Complaints of pain when palpated left breast on exam. Referred patient to the [facility] Center of [facility] for a left breast diagnostic mammogram and breast ultrasound. Appointment scheduled for Thursday, Jun 02, 2017 at 0830.        Pelvic/Bimanual No Pap smear completed today since last Pap smear and HPV typing was 03/28/2017. Pap smear not indicated per BCCCP guidelines.   Smoking History: Patient is a current smoker. Discussed smoking cessation with patient. Referred patient to the [facility] Quitline and gave resources to the free smoking cessation classes at [facility]  Patient Navigation: Patient education provided. Access to services provided for patient through BCCCP program.    Breast and Cervical Cancer Risk Assessment: Patient has a family history of her mother, maternal grandmother, and  maternal aunt being diagnosed with breast cancer. Patient has no known genetic mutations or history of radiation treatment to the chest before age 42. Patient has no history of cervical dysplasia, immunocompromised, or DES exposure in-utero.

## 2017-06-02 NOTE — Addendum Note (Signed)
Encounter addended by: Priscille Heidelberg, RN on: 06/02/2017 8:12 AM  Actions taken: Sign clinical note

## 2017-06-02 NOTE — Addendum Note (Signed)
Encounter addended by: Priscille Heidelberg, RN on: 06/02/2017 7:58 AM  Actions taken: Sign clinical note

## 2017-06-02 NOTE — Patient Instructions (Addendum)
Explained breast self awareness with Debra Weaver. Patient did not need a Pap smear today due to last Pap smear and HPV typing was 03/28/2017. Let her know BCCCP will cover Pap smears and HPV typing every 5 years unless has a history of abnormal Pap smears. Referred patient to the Breast Center of [facility] for a left breast diagnostic mammogram and breast ultrasound. Appointment scheduled for Thursday, Jun 02, 2017 at 0830. Patient aware of appointment and will be there.Discussed smoking cessation with patient. Referred patient to the [facility] Quitline and gave resources to the free smoking cessation classes at [facility]. Debra Weaver verbalized understanding.  Brannock, Kathaleen Maser, RN 7:57 AM

## 2017-06-02 NOTE — Addendum Note (Signed)
Encounter addended by: Priscille Heidelberg, RN on: 06/02/2017 8:03 AM  Actions taken: Sign clinical note

## 2017-06-16 ENCOUNTER — Inpatient Hospital Stay: Admission: RE | Admit: 2017-06-16 | Payer: Self-pay | Source: Ambulatory Visit

## 2017-06-22 ENCOUNTER — Ambulatory Visit
Admission: RE | Admit: 2017-06-22 | Discharge: 2017-06-22 | Disposition: A | Discharge status: Home / Self Care | Payer: No Typology Code available for payment source | Source: Ambulatory Visit | Attending: Obstetrics and Gynecology | Admitting: Obstetrics and Gynecology

## 2017-06-22 ENCOUNTER — Ambulatory Visit: Admission: RE | Admit: 2017-06-22 | Payer: No Typology Code available for payment source | Source: Ambulatory Visit

## 2017-06-22 ENCOUNTER — Other Ambulatory Visit (HOSPITAL_COMMUNITY): Payer: Self-pay | Admitting: Obstetrics and Gynecology

## 2017-06-22 DIAGNOSIS — N6320 Unspecified lump in the left breast, unspecified quadrant: Secondary | ICD-10-CM

## 2017-09-22 ENCOUNTER — Other Ambulatory Visit: Payer: No Typology Code available for payment source

## 2017-09-27 ENCOUNTER — Other Ambulatory Visit: Payer: No Typology Code available for payment source

## 2017-11-08 ENCOUNTER — Emergency Department (HOSPITAL_COMMUNITY)
Admission: EM | Admit: 2017-11-08 | Discharge: 2017-11-09 | Disposition: A | Discharge status: Home / Self Care | Payer: No Typology Code available for payment source | Attending: Emergency Medicine | Admitting: Emergency Medicine

## 2017-11-08 ENCOUNTER — Encounter (HOSPITAL_COMMUNITY): Payer: Self-pay | Admitting: Emergency Medicine

## 2017-11-08 DIAGNOSIS — R590 Localized enlarged lymph nodes: Secondary | ICD-10-CM

## 2017-11-08 DIAGNOSIS — F17210 Nicotine dependence, cigarettes, uncomplicated: Secondary | ICD-10-CM | POA: Insufficient documentation

## 2017-11-08 DIAGNOSIS — R197 Diarrhea, unspecified: Secondary | ICD-10-CM | POA: Insufficient documentation

## 2017-11-08 DIAGNOSIS — N3090 Cystitis, unspecified without hematuria: Secondary | ICD-10-CM | POA: Insufficient documentation

## 2017-11-08 LAB — COMPREHENSIVE METABOLIC PANEL
ALT: 14 U/L (ref 0–44)
ANION GAP: 9 (ref 5–15)
AST: 20 U/L (ref 15–41)
Albumin: 4 g/dL (ref 3.5–5.0)
Alkaline Phosphatase: 46 U/L (ref 38–126)
BUN: 7 mg/dL (ref 6–20)
CALCIUM: 9.3 mg/dL (ref 8.9–10.3)
CO2: 20 mmol/L — AB (ref 22–32)
Chloride: 109 mmol/L (ref 98–111)
Creatinine, Ser: 0.84 mg/dL (ref 0.44–1.00)
Glucose, Bld: 98 mg/dL (ref 70–99)
Potassium: 3.6 mmol/L (ref 3.5–5.1)
SODIUM: 138 mmol/L (ref 135–145)
TOTAL PROTEIN: 7.2 g/dL (ref 6.5–8.1)
Total Bilirubin: 0.9 mg/dL (ref 0.3–1.2)

## 2017-11-08 LAB — CBC
HCT: 40.2 % (ref 36.0–46.0)
Hemoglobin: 13.1 g/dL (ref 12.0–15.0)
MCH: 30.3 pg (ref 26.0–34.0)
MCHC: 32.6 g/dL (ref 30.0–36.0)
MCV: 92.8 fL (ref 80.0–100.0)
NRBC: 0 % (ref 0.0–0.2)
PLATELETS: 241 10*3/uL (ref 150–400)
RBC: 4.33 MIL/uL (ref 3.87–5.11)
RDW: 13.5 % (ref 11.5–15.5)
WBC: 4.5 10*3/uL (ref 4.0–10.5)

## 2017-11-08 LAB — URINALYSIS, ROUTINE W REFLEX MICROSCOPIC
BILIRUBIN URINE: NEGATIVE
GLUCOSE, UA: NEGATIVE mg/dL
Hgb urine dipstick: NEGATIVE
KETONES UR: 20 mg/dL — AB
NITRITE: POSITIVE — AB
PH: 6 (ref 5.0–8.0)
Protein, ur: 30 mg/dL — AB
SPECIFIC GRAVITY, URINE: 1.029 (ref 1.005–1.030)

## 2017-11-08 LAB — I-STAT BETA HCG BLOOD, ED (MC, WL, AP ONLY): I-stat hCG, quantitative: <5 m[IU]/mL (ref <5)

## 2017-11-08 LAB — LIPASE, BLOOD: Lipase: 22 U/L (ref 11–51)

## 2017-11-08 MED ORDER — CEPHALEXIN 500 MG PO CAPS: 500.0000 mg | ORAL_CAPSULE | Freq: Two times a day (BID) | ORAL | 0 refills | Status: AC

## 2017-11-08 NOTE — ED Provider Notes (Signed)
[facility] EMERGENCY DEPARTMENT Provider Note   CSN: [id number] Arrival date & time: 11/08/17  0744     History   Chief Complaint Chief Complaint  Patient presents with  . Diarrhea  . Groin Pain    HPI Debra Weaver is a 32 y.o. female.  HPI   Kaylie Ritter is a 32 y.o. female, with a history of bipolar, presenting to the ED with an area of swelling and tenderness to the right inguinal region noted yesterday.  She noted this while shaving the region.  She also complains of diarrhea with 2-3 loose stools daily for the past 3 days.  No recent antibiotic use.  She was diagnosed and treated for a left breast abscess earlier this year, but states she had follow-up ultrasound in August of this year and this was found to have been resolved.  Denies fever/chills, nausea/vomiting, constipation, hematochezia/melena, urinary symptoms, abdominal pain, swelling anywhere else, abnormal vaginal discharge/bleeding, or any other complaints.  Past Medical History:  Diagnosis Date  . Mental disorder    bipolar - stopped meds    There are no active problems to display for this patient.   Past Surgical History:  Procedure Laterality Date  . ABDOMINAL SURGERY  2014  . AUGMENTATION MAMMAPLASTY Bilateral 2015   silicone   . BLADDER REPAIR W/ CESAREAN SECTION    . CESAREAN SECTION       OB History    Gravida  4   Para  3   Term      Preterm      AB  1   Living        SAB  1   TAB      Ectopic      Multiple      Live Births  3            Home Medications    Prior to Admission medications   Medication Sig Start Date End Date Taking? Authorizing Provider  acetaminophen (TYLENOL) 325 MG tablet Take 2 tablets (650 mg total) by mouth every 6 (six) hours as needed. Do not take more than 4000mg  of tylenol per day Patient not taking: Reported on 04/05/2017 03/31/17   Couture, Cortni S, PA-C  cephALEXin (KEFLEX) 500 MG capsule Take 1 capsule (500 mg  total) by mouth 2 (two) times daily for 5 days. 11/08/17 11/13/17  Anselm Pancoast, PA-C    Family History Family History  Problem Relation Age of Onset  . Cancer Maternal Grandmother   . Breast cancer Maternal Grandmother   . Cancer Mother   . Breast cancer Mother   . Diabetes Neg Hx   . Hypertension Neg Hx     Social History Social History   Tobacco Use  . Smoking status: Current Every Day Smoker    Packs/day: 0.25  . Smokeless tobacco: Never Used  Substance Use Topics  . Alcohol use: Yes    Alcohol/week: 7.0 standard drinks    Types: 7 Glasses of wine per week  . Drug use: Yes    Frequency: 7.0 times per week    Types: Marijuana     Allergies   Patient has no known allergies.   Review of Systems Review of Systems  Constitutional: Negative for chills, diaphoresis and fever.  Respiratory: Negative for shortness of breath.   Cardiovascular: Negative for chest pain.  Gastrointestinal: Positive for diarrhea. Negative for abdominal pain, blood in stool, nausea and vomiting.  Genitourinary: Negative for dysuria, frequency, vaginal  bleeding and vaginal discharge.  Musculoskeletal: Negative for neck pain.  Hematological: Positive for adenopathy.  All other systems reviewed and are negative.    Physical Exam Updated Vital Signs BP 121/77 (BP Location: Right Arm)   Pulse 73   Temp 98.7 F (37.1 C) (Oral)   Resp 16   LMP 10/22/2017 (Exact Date)   SpO2 98%   Physical Exam  Constitutional: She appears well-developed and well-nourished. No distress.  HENT:  Head: Normocephalic and atraumatic.  Eyes: Conjunctivae are normal.  Neck: Neck supple.  Cardiovascular: Normal rate, regular rhythm, normal heart sounds and intact distal pulses.  Pulmonary/Chest: Effort normal and breath sounds normal. No respiratory distress.  Abdominal: Soft. There is no tenderness. There is no guarding.  Musculoskeletal: She exhibits no edema.  Lymphadenopathy:    She has no cervical  adenopathy.       Right: Inguinal adenopathy present.  Tender, singular, firm, mobile mass in the right inguinal chain.  No fluctuance noted. No cutaneous abnormalities noted, including no erythema, wounds, or obvious swelling upon initial inspection.  Neurological: She is alert.  Skin: Skin is warm and dry. She is not diaphoretic.  Psychiatric: She has a normal mood and affect. Her behavior is normal.  Nursing note and vitals reviewed.    ED Treatments / Results  Labs (all labs ordered are listed, but only abnormal results are displayed) Labs Reviewed  COMPREHENSIVE METABOLIC PANEL - Abnormal; Notable for the following components:      Result Value   CO2 20 (*)    All other components within normal limits  URINALYSIS, ROUTINE W REFLEX MICROSCOPIC - Abnormal; Notable for the following components:   Color, Urine AMBER (*)    APPearance HAZY (*)    Ketones, ur 20 (*)    Protein, ur 30 (*)    Nitrite POSITIVE (*)    Leukocytes, UA TRACE (*)    Bacteria, UA MANY (*)    All other components within normal limits  LIPASE, BLOOD  CBC  I-STAT BETA HCG BLOOD, ED ([facility], [facility], [facility] ONLY)    EKG None  Radiology No results found.  Procedures Procedures (including critical care time)  Medications Ordered in ED Medications - No data to display   Initial Impression / Assessment and Plan / ED Course  I have reviewed the triage vital signs and the nursing notes.  Pertinent labs & imaging results that were available during my care of the patient were reviewed by me and considered in my medical decision making (see chart for details).  Clinical Course as of Nov 08 1200  Tue Nov 08, 2017  1152 RN states patient no longer in the room. I attempted to call the patient using her listed cell phone number [phone]), but this number had a busy signal. Called the listed home number [phone]), reached a VM, and left a message.  I also tried to call the listed spouses number, but there was  no answer.   [SJ]  1200 Patient called back and I was able to speak with her.  I discussed her urinalysis results, I confirmed her pharmacy of choice, and let her know a prescription for antibiotic has been sent to this pharmacy.  Patient voiced understanding.   [SJ]    Clinical Course User Index [SJ] Harlan Vinal C, PA-C     Patient presents with a mass in the right inguinal region, suggestive of inflamed lymph node.  It appears to be isolated at this time, with no other  noted lymphadenopathy.  Lab results reassuring. Patient is nontoxic appearing, afebrile, not tachycardic, not tachypneic, not hypotensive, maintains excellent SPO2 on room air, and is in no apparent distress.  She will follow-up with her primary care provider on this matter.    While waiting for results of the urine, patient was noted to have eloped.  Urinalysis shows evidence of possible infection.  Patient was otherwise stable for discharge. I was able to get ahold the patient by phone and discussed these results with her.  Findings and plan of care discussed with Cathren Laine, MD.   Vitals:   11/08/17 0915 11/08/17 0930 11/08/17 0945 11/08/17 1000  BP:  122/75  130/77  Pulse: 69 61 69 80  Resp:    16  Temp:      TempSrc:      SpO2: 99% 100% 100% 100%     Final Clinical Impressions(s) / ED Diagnoses   Final diagnoses:  Lymphadenopathy, inguinal  Diarrhea, unspecified type  Cystitis    ED Discharge Orders         Ordered    cephALEXin (KEFLEX) 500 MG capsule  2 times daily     11/08/17 1157           Anselm Pancoast, PA-C 11/08/17 1202    Cathren Laine, MD 11/08/17 1314

## 2017-11-08 NOTE — ED Notes (Signed)
Pt noted as gone from room

## 2017-11-08 NOTE — ED Triage Notes (Signed)
Patient to ED c/o diarrhea since Saturday. Also reports a lump to the right side of her groin since yesterday with pain and discomfort. She states she had to have a bladder repair (from c-section) in 2013 and feels like it could be related to "what the surgeons did." She denies urinary symptoms, N/V, fevers/chills.

## 2017-11-08 NOTE — Discharge Instructions (Signed)
Diarrhea  Hand washing: Wash your hands throughout the day, but especially before and after touching the face, using the restroom, sneezing, coughing, or touching surfaces that have been coughed or sneezed upon. Hydration: Symptoms will be intensified and complicated by dehydration. Dehydration can also extend the duration of symptoms. Drink plenty of fluids and get plenty of rest. You should be drinking at least half a liter of water an hour to stay hydrated. Electrolyte drinks (ex. Gatorade, Powerade, Pedialyte) are also encouraged. You should be drinking enough fluids to make your urine light yellow, almost clear. If this is not the case, you are not drinking enough water. Please note that some of the treatments indicated below will not be effective if you are not adequately hydrated. Diet: Please concentrate on hydration, however, you may introduce food slowly.  Start with a clear liquid diet, progressed to a full liquid diet, and then bland solids as you are able. Pain: Ibuprofen, Naproxen, or Tylenol for pain or fever.  Diarrhea: May use medications such as loperamide (Imodium) or Bismuth subsalicylate (Pepto-Bismol). Follow-up: Follow-up with a primary care provider on this matter. Return: Return should you develop a fever, bloody diarrhea, abdominal pain, uncontrolled vomiting, or any other major concerns.  For prescription assistance, may try using prescription discount sites or apps, such as goodrx.com  For the area of groin swelling and tenderness, please follow-up with a primary care provider.  May apply warm compresses and use ibuprofen or naproxen to reduce inflammation.

## 2018-03-12 ENCOUNTER — Ambulatory Visit (HOSPITAL_COMMUNITY)
Admission: EM | Admit: 2018-03-12 | Discharge: 2018-03-12 | Discharge status: Against Medical Advice | Payer: BLUE CROSS/BLUE SHIELD | Source: Home / Self Care

## 2018-03-12 ENCOUNTER — Emergency Department (HOSPITAL_COMMUNITY)
Admission: EM | Admit: 2018-03-12 | Discharge: 2018-03-12 | Disposition: A | Discharge status: Home / Self Care | Payer: BLUE CROSS/BLUE SHIELD | Attending: Emergency Medicine | Admitting: Emergency Medicine

## 2018-03-12 DIAGNOSIS — L539 Erythematous condition, unspecified: Secondary | ICD-10-CM | POA: Diagnosis present

## 2018-03-12 DIAGNOSIS — N611 Abscess of the breast and nipple: Secondary | ICD-10-CM | POA: Diagnosis not present

## 2018-03-12 DIAGNOSIS — F1290 Cannabis use, unspecified, uncomplicated: Secondary | ICD-10-CM | POA: Insufficient documentation

## 2018-03-12 DIAGNOSIS — F17210 Nicotine dependence, cigarettes, uncomplicated: Secondary | ICD-10-CM | POA: Diagnosis not present

## 2018-03-12 MED ORDER — CLINDAMYCIN HCL 300 MG PO CAPS: 300.0000 mg | ORAL_CAPSULE | Freq: Four times a day (QID) | ORAL | 0 refills | Status: AC

## 2018-03-12 MED ORDER — HYDROCODONE-ACETAMINOPHEN 5-325 MG PO TABS: 1.0000 | ORAL_TABLET | Freq: Four times a day (QID) | ORAL | 0 refills | Status: AC | PRN

## 2018-03-12 NOTE — ED Notes (Signed)
Pt states she has an abscess in her breast and stated "I want this out of me now". Reviewed patients chart with Dr. Delton See, spoke with patient and told her we could do pain control only and she would have to follow up with the breast center tomorrow. Pt angry and stated "yall just fucking wasted my fucking time". Slammed the door and left the building.

## 2018-03-12 NOTE — ED Triage Notes (Signed)
Pt here for evaluation of a "cyst" in her left breast. Sts she was seen at [facility] for it last week and was given abx and it has worsened since then. Denies fevers.

## 2018-03-12 NOTE — ED Provider Notes (Signed)
[facility] EMERGENCY DEPARTMENT Provider Note   CSN: [id number] Arrival date & time: 03/12/18  1150     History   Chief Complaint Chief Complaint  Patient presents with  . Breast Pain    HPI Debra Weaver is a 33 y.o. female.  Patient is a 33 year old female who presents with a breast abscess.  She has redness and swelling to her left breast for the last 3 to 4 days.  She was seen initially in urgent care and started on doxycycline.  She says it is more painful.  She denies any known fevers.  She had a similar abscess about a year ago that ultimately was drained at the breast center.     Past Medical History:  Diagnosis Date  . Mental disorder    bipolar - stopped meds    There are no active problems to display for this patient.   Past Surgical History:  Procedure Laterality Date  . ABDOMINAL SURGERY  2014  . AUGMENTATION MAMMAPLASTY Bilateral 2015   silicone   . BLADDER REPAIR W/ CESAREAN SECTION    . CESAREAN SECTION       OB History    Gravida  4   Para  3   Term      Preterm      AB  1   Living        SAB  1   TAB      Ectopic      Multiple      Live Births  3            Home Medications    Prior to Admission medications   Medication Sig Start Date End Date Taking? Authorizing Provider  acetaminophen (TYLENOL) 325 MG tablet Take 2 tablets (650 mg total) by mouth every 6 (six) hours as needed. Do not take more than 4000mg  of tylenol per day Patient not taking: Reported on 04/05/2017 03/31/17   Couture, Cortni S, PA-C  clindamycin (CLEOCIN) 300 MG capsule Take 1 capsule (300 mg total) by mouth 4 (four) times daily. X 7 days 03/12/18   Rolan Bucco, MD  HYDROcodone-acetaminophen (NORCO/VICODIN) 5-325 MG tablet Take 1-2 tablets by mouth every 6 (six) hours as needed. 03/12/18   Rolan Bucco, MD    Family History Family History  Problem Relation Age of Onset  . Cancer Maternal Grandmother   . Breast cancer Maternal  Grandmother   . Cancer Mother   . Breast cancer Mother   . Diabetes Neg Hx   . Hypertension Neg Hx     Social History Social History   Tobacco Use  . Smoking status: Current Every Day Smoker    Packs/day: 0.25  . Smokeless tobacco: Never Used  Substance Use Topics  . Alcohol use: Yes    Alcohol/week: 7.0 standard drinks    Types: 7 Glasses of wine per week  . Drug use: Yes    Frequency: 7.0 times per week    Types: Marijuana     Allergies   Patient has no known allergies.   Review of Systems Review of Systems  Constitutional: Negative for chills, diaphoresis, fatigue and fever.  HENT: Negative for congestion, rhinorrhea and sneezing.   Eyes: Negative.   Respiratory: Negative for cough, chest tightness and shortness of breath.   Cardiovascular: Negative for chest pain and leg swelling.  Gastrointestinal: Negative for abdominal pain, blood in stool, diarrhea, nausea and vomiting.  Genitourinary: Negative for difficulty urinating, flank pain, frequency and  hematuria.  Musculoskeletal: Negative for arthralgias and back pain.  Skin: Positive for color change and wound. Negative for rash.  Neurological: Negative for dizziness, speech difficulty, weakness, numbness and headaches.     Physical Exam Updated Vital Signs BP 111/73 (BP Location: Right Arm)   Pulse 91   Temp 99.2 F (37.3 C) (Oral)   Resp 20   Ht 5\' 4"  (1.626 m)   Wt 62.1 kg   LMP 03/05/2018 (Exact Date)   SpO2 100%   BMI 23.52 kg/m   Physical Exam Constitutional:      Appearance: She is well-developed.  HENT:     Head: Normocephalic and atraumatic.  Eyes:     Pupils: Pupils are equal, round, and reactive to light.  Neck:     Musculoskeletal: Normal range of motion and neck supple.  Cardiovascular:     Rate and Rhythm: Normal rate and regular rhythm.     Heart sounds: Normal heart sounds.  Pulmonary:     Effort: Pulmonary effort is normal. No respiratory distress.     Breath sounds: Normal  breath sounds. No wheezing or rales.  Chest:     Chest wall: No tenderness.  Abdominal:     General: Bowel sounds are normal.     Palpations: Abdomen is soft.     Tenderness: There is no abdominal tenderness. There is no guarding or rebound.  Musculoskeletal: Normal range of motion.  Lymphadenopathy:     Cervical: No cervical adenopathy.  Skin:    General: Skin is warm and dry.     Findings: No rash.     Comments: Patient has moderate area of tenderness and erythema adjacent to the left nipple of her left breast.  It is firm and indurated.  There is no drainage from the nipple.  It is tender to palpation.  Neurological:     Mental Status: She is alert and oriented to person, place, and time.      ED Treatments / Results  Labs (all labs ordered are listed, but only abnormal results are displayed) Labs Reviewed - No data to display  EKG None  Radiology No results found.  Procedures Procedures (including critical care time)  Medications Ordered in ED Medications - No data to display   Initial Impression / Assessment and Plan / ED Course  I have reviewed the triage vital signs and the nursing notes.  Pertinent labs & imaging results that were available during my care of the patient were reviewed by me and considered in my medical decision making (see chart for details).     Patient presents with a left breast abscess adjacent to her left nipple.  She is not systemically ill.  She had a similar finding last year that was ultimately drained by the breast center.  I did advise her that we can change her antibiotic and start her on some pain medication but she needs to call her breast center tomorrow to have it drained there.  Final Clinical Impressions(s) / ED Diagnoses   Final diagnoses:  Breast abscess    ED Discharge Orders         Ordered    clindamycin (CLEOCIN) 300 MG capsule  4 times daily     03/12/18 1207    HYDROcodone-acetaminophen (NORCO/VICODIN) 5-325 MG  tablet  Every 6 hours PRN     03/12/18 1207           Rolan Bucco, MD 03/12/18 1211

## 2018-03-13 ENCOUNTER — Ambulatory Visit
Admission: RE | Admit: 2018-03-13 | Discharge: 2018-03-13 | Disposition: A | Discharge status: Home / Self Care | Payer: No Typology Code available for payment source | Source: Ambulatory Visit | Attending: *Deleted | Admitting: *Deleted

## 2018-03-13 ENCOUNTER — Ambulatory Visit
Admission: RE | Admit: 2018-03-13 | Discharge: 2018-03-13 | Disposition: A | Discharge status: Home / Self Care | Payer: BLUE CROSS/BLUE SHIELD | Source: Ambulatory Visit | Attending: *Deleted | Admitting: *Deleted

## 2018-03-13 ENCOUNTER — Other Ambulatory Visit: Payer: Self-pay | Admitting: *Deleted

## 2018-03-13 DIAGNOSIS — N611 Abscess of the breast and nipple: Secondary | ICD-10-CM

## 2018-03-19 LAB — AEROBIC/ANAEROBIC CULTURE (SURGICAL/DEEP WOUND)

## 2018-03-19 LAB — AEROBIC/ANAEROBIC CULTURE W GRAM STAIN (SURGICAL/DEEP WOUND)

## 2019-03-13 ENCOUNTER — Institutional Professional Consult (permissible substitution): Payer: No Typology Code available for payment source | Admitting: Pulmonary Disease

## 2020-11-16 IMAGING — US ULTRASOUND GUIDED BREAST CYST ASPIRATION
1 series · 2 of 2 positions shown · non-contrast
Comparison: Previous exams.

Addendum:
CLINICAL DATA: Intradermal abscess in the 8 o'clock location of the
LEFT areola.

EXAM:
ULTRASOUND GUIDED LEFT BREAST CYST ASPIRATION

[Series 1: ultrasound guided breast cyst aspiration · 0.07mm/px · 2 of 2 slices shown]
[im 1/2]
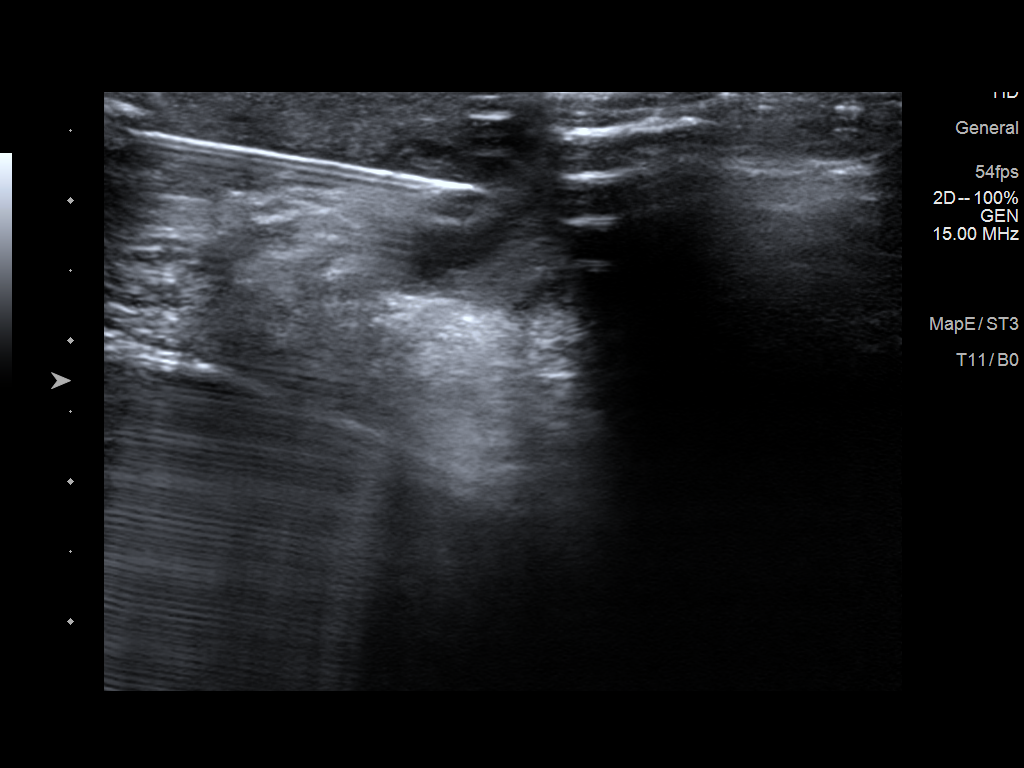
[im 2/2]
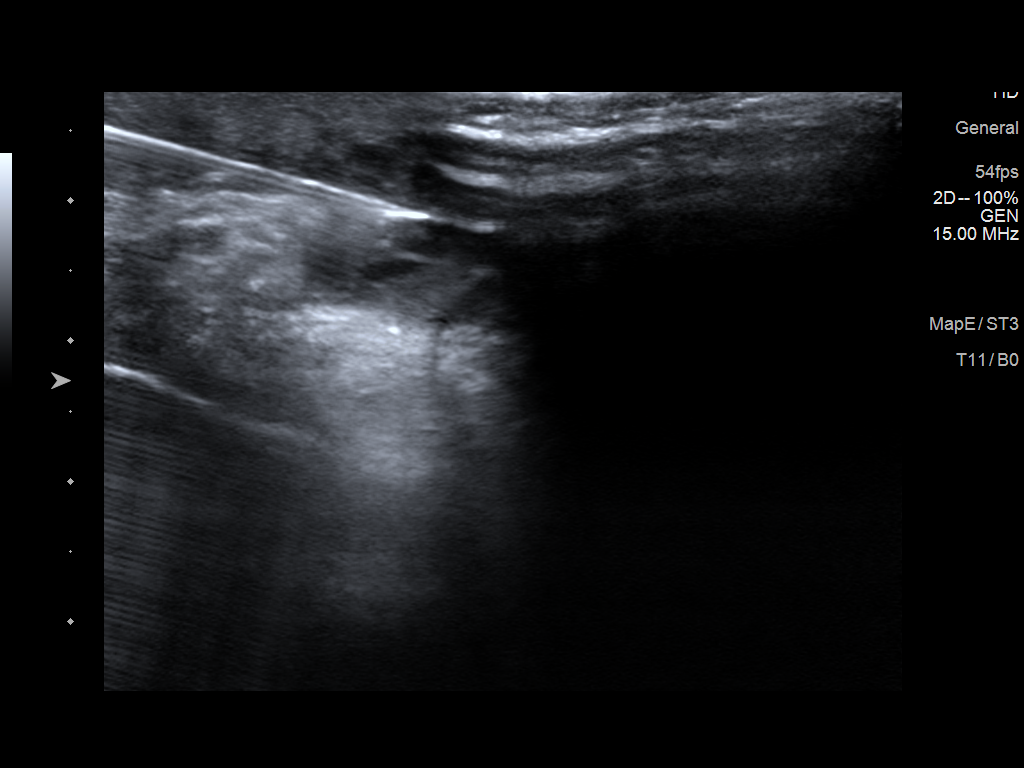

[2 of 2 positions shown; findings below may reference images not displayed]

PROCEDURE:
Using sterile technique, 1% lidocaine, under direct ultrasound
visualization, needle aspiration of mass in the 8 o'clock
retroareolar region of the LEFT breast was performed. Approximately
0.5 ml of cloudy white fluid was aspirated and sent for
microbiology. There is little change in size/appearance of the
lesion following aspiration.

The ultrasound exam and the aspiration were extremely painful for
the patient.
IMPRESSION: Ultrasound-guided aspiration of intradermal LEFT breast abscess. No
apparent complications.

RECOMMENDATIONS:
1. Given the location of the abscess, small amount of fluid
aspirated today, and significant pain, I would recommend surgical
consultation regarding possible incision and drainage of this
lesion.
2. Continue antibiotic treatment.
3. Followup ultrasound has not been scheduled, pending surgical
consultation.

ADDENDUM:
Surgical consultation has been arranged with Sajla, Tusimel at
[REDACTED] on March 14, 2018.

Gladys Berk, RN on 03/14/2018.

ADDENDUM:
LEFT breast aspiration yielded Gram Stain ABUNDANT WBC PRESENT,
PREDOMINANTLY PMN, MODERATE GRAM POSITIVE COCCI, FEW GRAM VARIABLE
ROD. Culture MODERATE STREPTOCOCCUS GROUP C, FEW PREVOTELLA DISIENS,
BETA LACTAMASE POSITIVE.

The patient was referred to [REDACTED] on March 14, 2018. An incision and drainage of the abscess was performed in the
office on that day.

The patient is being followed by Birahima Depardieu at [[email or web address]].

Pathology results reported by Gladys Berk, RN on 03/21/2018.

*** End of Addendum ***

## 2020-12-22 ENCOUNTER — Emergency Department (HOSPITAL_COMMUNITY)
Admission: EM | Admit: 2020-12-22 | Discharge: 2020-12-22 | Disposition: A | Discharge status: Home / Self Care | Payer: No Typology Code available for payment source | Attending: Emergency Medicine | Admitting: Emergency Medicine

## 2020-12-22 ENCOUNTER — Other Ambulatory Visit: Payer: Self-pay

## 2020-12-22 ENCOUNTER — Encounter (HOSPITAL_COMMUNITY): Payer: Self-pay | Admitting: Emergency Medicine

## 2020-12-22 DIAGNOSIS — H209 Unspecified iridocyclitis: Secondary | ICD-10-CM

## 2020-12-22 DIAGNOSIS — H44131 Sympathetic uveitis, right eye: Secondary | ICD-10-CM | POA: Insufficient documentation

## 2020-12-22 DIAGNOSIS — F17210 Nicotine dependence, cigarettes, uncomplicated: Secondary | ICD-10-CM | POA: Insufficient documentation

## 2020-12-22 MED ORDER — GATIFLOXACIN 0.5 % OP SOLN
1.0000 [drp] | Freq: Four times a day (QID) | OPHTHALMIC | Status: DC
  Administered 2020-12-22: 16:00:00 1 [drp] via OPHTHALMIC
  Filled 2020-12-22: qty 2.5

## 2020-12-22 MED ORDER — TETRACAINE HCL 0.5 % OP SOLN
2.0000 [drp] | Freq: Once | OPHTHALMIC | Status: AC
  Administered 2020-12-22: 14:00:00 2 [drp] via OPHTHALMIC
  Filled 2020-12-22: qty 4

## 2020-12-22 MED ORDER — FLUORESCEIN SODIUM 1 MG OP STRP
1.0000 | ORAL_STRIP | Freq: Once | OPHTHALMIC | Status: AC
  Administered 2020-12-22: 14:00:00 1 via OPHTHALMIC
  Filled 2020-12-22: qty 1

## 2020-12-22 MED ORDER — CYCLOPENTOLATE HCL 1 % OP SOLN
1.0000 [drp] | Freq: Once | OPHTHALMIC | Status: AC
  Administered 2020-12-22: 16:00:00 1 [drp] via OPHTHALMIC
  Filled 2020-12-22: qty 2

## 2020-12-22 NOTE — ED Provider Notes (Signed)
[facility]-EMERGENCY DEPT Provider Note   CSN: [id number] Arrival date & time: 12/22/20  1225     History Chief Complaint  Patient presents with   Eye Problem    Debra Weaver is a 35 y.o. female.  The history is provided by the patient. No language interpreter was used.  Eye Problem Associated symptoms: photophobia and redness   Associated symptoms: no discharge, no headaches and no itching    35 year old female who presents for evaluation of eye irritation.  Patient reported having pain to her right eyes since yesterday.  She described pain as a soreness sensation, with associated redness, and photophobia.  She also endorsed excessive tearing from her eyes.  Pain is moderate in severity worsening with blinking and movement.  She did recall using contact lens in which she removed it yesterday.  She also admits to using eyelash glue 2 days ago and unsure if it could be contributing to her eye problem.  She does have an eye specialist.  She denies diplopia headache runny nose sore throat or cough.  Past Medical History:  Diagnosis Date   Mental disorder    bipolar - stopped meds    There are no problems to display for this patient.   Past Surgical History:  Procedure Laterality Date   ABDOMINAL SURGERY  2014   AUGMENTATION MAMMAPLASTY Bilateral 2015   silicone    BLADDER REPAIR W/ CESAREAN SECTION     CESAREAN SECTION       OB History     Gravida  4   Para  3   Term      Preterm      AB  1   Living         SAB  1   IAB      Ectopic      Multiple      Live Births  3           Family History  Problem Relation Age of Onset   Cancer Maternal Grandmother    Breast cancer Maternal Grandmother    Cancer Mother    Breast cancer Mother    Diabetes Neg Hx    Hypertension Neg Hx     Social History   Tobacco Use   Smoking status: Every Day    Packs/day: 0.25    Types: Cigarettes   Smokeless tobacco: Never  Vaping Use    Vaping Use: Never used  Substance Use Topics   Alcohol use: Yes    Alcohol/week: 7.0 standard drinks    Types: 7 Glasses of wine per week   Drug use: Yes    Frequency: 7.0 times per week    Types: Marijuana    Home Medications Prior to Admission medications   Medication Sig Start Date End Date Taking? Authorizing Provider  acetaminophen (TYLENOL) 325 MG tablet Take 2 tablets (650 mg total) by mouth every 6 (six) hours as needed. Do not take more than 4000mg  of tylenol per day Patient not taking: Reported on 04/05/2017 03/31/17   Couture, Cortni S, PA-C  clindamycin (CLEOCIN) 300 MG capsule Take 1 capsule (300 mg total) by mouth 4 (four) times daily. X 7 days 03/12/18   03/14/18, MD  HYDROcodone-acetaminophen (NORCO/VICODIN) 5-325 MG tablet Take 1-2 tablets by mouth every 6 (six) hours as needed. 03/12/18   03/14/18, MD    Allergies    Patient has no known allergies.  Review of Systems   Review of Systems  Constitutional:  Negative for fever.  Eyes:  Positive for photophobia, pain and redness. Negative for discharge, itching and visual disturbance.  Neurological:  Negative for headaches.   Physical Exam Updated Vital Signs BP 118/84 (BP Location: Left Arm)   Pulse (!) 106   Temp 98.3 F (36.8 C) (Oral)   Resp 16   LMP 12/11/2020   SpO2 98%   Physical Exam Vitals and nursing note reviewed.  Constitutional:      General: She is not in acute distress.    Appearance: She is well-developed.  HENT:     Head: Atraumatic.  Eyes:     General: Lids are normal. Lids are everted, no foreign bodies appreciated. Vision grossly intact. Gaze aligned appropriately.        Right eye: No foreign body, discharge or hordeolum.     Intraocular pressure: Right eye pressure is 16 mmHg.     Extraocular Movements: Extraocular movements intact.     Conjunctiva/sclera:     Right eye: Right conjunctiva is injected. No chemosis, exudate or hemorrhage.    Pupils: Pupils are equal, round,  and reactive to light.     Right eye: No corneal abrasion or fluorescein uptake. Seidel exam negative.     Slit lamp exam:    Right eye: Photophobia present. No corneal ulcer, foreign body or anterior chamber bulge.  Pulmonary:     Effort: Pulmonary effort is normal.  Musculoskeletal:     Cervical back: Neck supple.  Skin:    Findings: No rash.  Neurological:     Mental Status: She is alert.  Psychiatric:        Mood and Affect: Mood normal.    ED Results / Procedures / Treatments   Labs (all labs ordered are listed, but only abnormal results are displayed) Labs Reviewed - No data to display  EKG None  Radiology No results found.  Procedures Procedures   Medications Ordered in ED Medications  cyclopentolate (CYCLODRYL,CYCLOGYL) 1 % ophthalmic solution 1 drop (has no administration in time range)  gatifloxacin (ZYMAXID) 0.5 % ophthalmic drops 1 drop (has no administration in time range)  tetracaine (PONTOCAINE) 0.5 % ophthalmic solution 2 drop (2 drops Right Eye Given by Other 12/22/20 1419)  fluorescein ophthalmic strip 1 strip (1 strip Both Eyes Given 12/22/20 1420)    ED Course  I have reviewed the triage vital signs and the nursing notes.  Pertinent labs & imaging results that were available during my care of the patient were reviewed by me and considered in my medical decision making (see chart for details).    MDM Rules/Calculators/A&P                           BP 118/84 (BP Location: Left Arm)   Pulse (!) 106   Temp 98.3 F (36.8 C) (Oral)   Resp 16   LMP 12/11/2020   SpO2 98%   Final Clinical Impression(s) / ED Diagnoses Final diagnoses:  Uveitis of right eye    Rx / DC Orders ED Discharge Orders     None      2:48 PM Patient presents complaining of right eye redness and pain with light sensitivity since yesterday.  On exam, injected conjunctival with ciliary flush.  Normal intraocular pressure, no corneal abrasion noted of foreign body  noted.  Pain with palpation of the eye.  Symptoms suggestive of iritis/uveitis or keratitis.  No crusting of the eye, just excessive tearing.  We will reach out to on-call ophthalmologist for recommendation.  3:02 PM Appreciate consultation from eye specialist Dr. Valetta Close who request photo image of pt's eye to be sent to his phone.  HIPPA compliant pictures were sent with permission of pt.    3:15 PM Dr. Valetta Close have viewed the picture.  He recommend giving pt 1 drop of cyclopentalate as well as 1 drop of moxifloxicin eye drop in the ER.  She is to apply 1 drop of moxifloxacin q3hr until seen by him tomorrow at 8:15 AM in the office   Domenic Moras, PA-C 12/22/20 1539    Valarie Merino, MD 12/23/20 [phone]

## 2020-12-22 NOTE — Discharge Instructions (Signed)
You have been evaluated for your eye discomfort.  It is likely iritis/uveitis.  Please apply antibiotic eye drop 1 drop every 3 hours throughout the day today and go to his office at 8:15AM tomorrow for further care.

## 2020-12-22 NOTE — ED Triage Notes (Signed)
PT c/o irritation to R eye since yesterday. Wears contacts. States she thinks she used the wrong eyelash glue. Tearing from eye.

## 2023-05-29 DIAGNOSIS — W228XXA Striking against or struck by other objects, initial encounter: Secondary | ICD-10-CM | POA: Diagnosis not present

## 2023-05-29 DIAGNOSIS — S8991XA Unspecified injury of right lower leg, initial encounter: Secondary | ICD-10-CM | POA: Diagnosis not present

## 2023-06-02 DIAGNOSIS — F5101 Primary insomnia: Secondary | ICD-10-CM | POA: Diagnosis not present

## 2023-06-02 DIAGNOSIS — F314 Bipolar disorder, current episode depressed, severe, without psychotic features: Secondary | ICD-10-CM | POA: Diagnosis not present
